# Patient Record
Sex: Male | Born: 1985 | Hispanic: Yes | Smoking: Current every day smoker
Health system: Southern US, Community
[De-identification: ages and names within clinical notes are randomized; demographics above are authoritative.]

---

## 2015-12-18 ENCOUNTER — Emergency Department: Payer: Self-pay

## 2015-12-18 ENCOUNTER — Encounter: Payer: Self-pay | Admitting: Emergency Medicine

## 2015-12-18 DIAGNOSIS — W01198A Fall on same level from slipping, tripping and stumbling with subsequent striking against other object, initial encounter: Secondary | ICD-10-CM | POA: Insufficient documentation

## 2015-12-18 DIAGNOSIS — F17210 Nicotine dependence, cigarettes, uncomplicated: Secondary | ICD-10-CM | POA: Insufficient documentation

## 2015-12-18 DIAGNOSIS — Y9301 Activity, walking, marching and hiking: Secondary | ICD-10-CM | POA: Insufficient documentation

## 2015-12-18 DIAGNOSIS — Y929 Unspecified place or not applicable: Secondary | ICD-10-CM | POA: Insufficient documentation

## 2015-12-18 DIAGNOSIS — Y990 Civilian activity done for income or pay: Secondary | ICD-10-CM | POA: Insufficient documentation

## 2015-12-18 DIAGNOSIS — M25462 Effusion, left knee: Secondary | ICD-10-CM | POA: Insufficient documentation

## 2015-12-18 NOTE — ED Notes (Signed)
Pt presents ED to be evaluated for left knee injury. Pt states he fell on it while walking and carrying a heavy object around 4pm today. No deformity noted. Pulse and sensation present at distal extremity.

## 2015-12-19 ENCOUNTER — Emergency Department
Admission: EM | Admit: 2015-12-19 | Discharge: 2015-12-19 | Disposition: A | Discharge status: Home / Self Care | Payer: Self-pay | Attending: Emergency Medicine | Admitting: Emergency Medicine

## 2015-12-19 DIAGNOSIS — M25562 Pain in left knee: Secondary | ICD-10-CM

## 2015-12-19 DIAGNOSIS — M25462 Effusion, left knee: Secondary | ICD-10-CM

## 2015-12-19 MED ORDER — HYDROCODONE-ACETAMINOPHEN 5-325 MG PO TABS
1.0000 | ORAL_TABLET | Freq: Once | ORAL | Status: AC
  Administered 2015-12-19: 1 via ORAL
  Filled 2015-12-19: qty 1

## 2015-12-19 MED ORDER — HYDROCODONE-ACETAMINOPHEN 5-325 MG PO TABS: 1.0000 | ORAL_TABLET | ORAL | Status: AC | PRN

## 2015-12-19 NOTE — ED Notes (Signed)
Pt reports he was at work when he slipped and hit his knee, feels like something is out of place. States he is able to bear weight but it hurts and feels swollen.

## 2015-12-19 NOTE — Discharge Instructions (Signed)
Please call the number for orthopedics if your knee pain and swelling does not improve over the next 1-2 weeks. Please use your crutches as needed for discomfort. Take your pain medication as needed, but only as prescribed. You may also take ibuprofen for discomfort.   Knee Effusion Knee effusion means that you have extra fluid in your knee. This can cause pain. Your knee may be more difficult to bend and move. HOME CARE  Use crutches as told by your doctor.  Wear a knee brace as told by your doctor.  Apply ice to the swollen area:  Put ice in a plastic bag.  Place a towel between your skin and the bag.  Leave the ice on for 20 minutes, 2-3 times per day.  Keep your knee raised (elevated) when you are sitting or lying down.  Take medicines only as told by your doctor.  Do any rehabilitation or strengthening exercises as told by your doctor.  Rest your knee as told by your doctor. You may start doing your normal activities again when your doctor says it is okay.  Keep all follow-up visits as told by your doctor. This is important. GET HELP IF:   You continue to have pain in your knee. GET HELP RIGHT AWAY IF:  You have increased swelling or redness of your knee.  You have severe pain in your knee.  You have a fever.   This information is not intended to replace advice given to you by your health care provider. Make sure you discuss any questions you have with your health care provider.   Document Released: 09/25/2010 Document Revised: 09/13/2014 Document Reviewed: 04/08/2014 Elsevier Interactive Patient Education [employer]2016 [employer]

## 2015-12-19 NOTE — ED Provider Notes (Signed)
[facility] Emergency Department Provider Note  Time seen: 1:25 AM  I have reviewed the triage vital signs and the nursing notes.   HISTORY  Chief Complaint Knee Injury Hospital interpreter used for this evaluation   HPI Richard Krause is a 30 y.o. male with no past medical history who presents to the emergency department left knee pain. According to the patient he slipped and fell hitting his left knee around 4 PM. States he's been able to walk but it is painful to walk on and it has swollen so he came to the emergency department for evaluation. Describes the pain as moderate, worse with ambulation. Denies any other injuries such as head injury or LOC.     History reviewed. No pertinent past medical history.  There are no active problems to display for this patient.   History reviewed. No pertinent past surgical history.  No current outpatient prescriptions on file.  Allergies Review of patient's allergies indicates no known allergies.  History reviewed. No pertinent family history.  Social History Social History  Substance Use Topics  . Smoking status: Current Every Day Smoker -- 0.40 packs/day    Types: Cigarettes  . Smokeless tobacco: Never Used  . Alcohol Use: Yes     Comment: 1 a week    Review of Systems Constitutional: Negative for fever. Cardiovascular: Negative for chest pain. Respiratory: Negative for shortness of breath. Gastrointestinal: Negative for abdominal pain Musculoskeletal: Moderate left knee pain 10-point ROS otherwise negative.  ____________________________________________   PHYSICAL EXAM:  VITAL SIGNS: ED Triage Vitals  Enc Vitals Group     BP 12/18/15 2252 118/74 mmHg     Pulse Rate 12/18/15 2252 99     Resp 12/18/15 2252 18     Temp 12/18/15 2252 98 F (36.7 C)     Temp Source 12/18/15 2252 Oral     SpO2 12/18/15 2252 96 %     Weight --      Height --      Head Cir --      Peak Flow --      Pain  Score 12/18/15 2246 4     Pain Loc --      Pain Edu? --      Excl. in GC? --     Constitutional: Alert and oriented. Well appearing and in no distress. Eyes: Normal exam ENT    Head: Normocephalic and atraumatic.    Mouth/Throat: Mucous membranes are moist. Cardiovascular: Normal rate, regular rhythm. No murmur Respiratory: Normal respiratory effort without tachypnea nor retractions. Breath sounds are clear Gastrointestinal: Soft and nontender. No distention.   Musculoskeletal: Moderate left knee joint effusion, moderate tenderness to palpation. Patella is midline, does not appear displaced. Neurovascularly intact distally with 2+ DP pulse, sensation is normal. Neurologic:  Normal speech and language. No gross focal neurologic deficits Skin:  Skin is warm, dry and intact.  Psychiatric: Mood and affect are normal. Speech and behavior are normal.  ____________________________________________   RADIOLOGY  X-ray shows joint effusion but negative for fracture.   INITIAL IMPRESSION / ASSESSMENT AND PLAN / ED COURSE  Pertinent labs & imaging results that were available during my care of the patient were reviewed by me and considered in my medical decision making (see chart for details).  Patient with left knee joint effusion, negative for fracture. Moderate tenderness with range of motion on exam. We will place the patient in an Ace bandage for compression, I discussed elevating and icing the knee.  We will discharge with crutches with weightbearing as tolerated and orthopedic follow-up in 1-2 weeks if not improved. Patient agreeable to plan.  ____________________________________________   FINAL CLINICAL IMPRESSION(S) / ED DIAGNOSES  Left knee pain Joint effusion   Minna AntisKevin Deniese Oberry, MD 12/19/15 [phone]

## 2017-01-07 IMAGING — CR DG KNEE 1-2V*L*
1 series · 2 of 2 positions shown · non-contrast
Comparison: None.

CLINICAL DATA: Pain following fall

EXAM:
LEFT KNEE - 1-2 VIEW

[Series 1: dg knee 1-2 views left · 0.14mm/px · 2 of 2 slices shown]
[im 1/2]
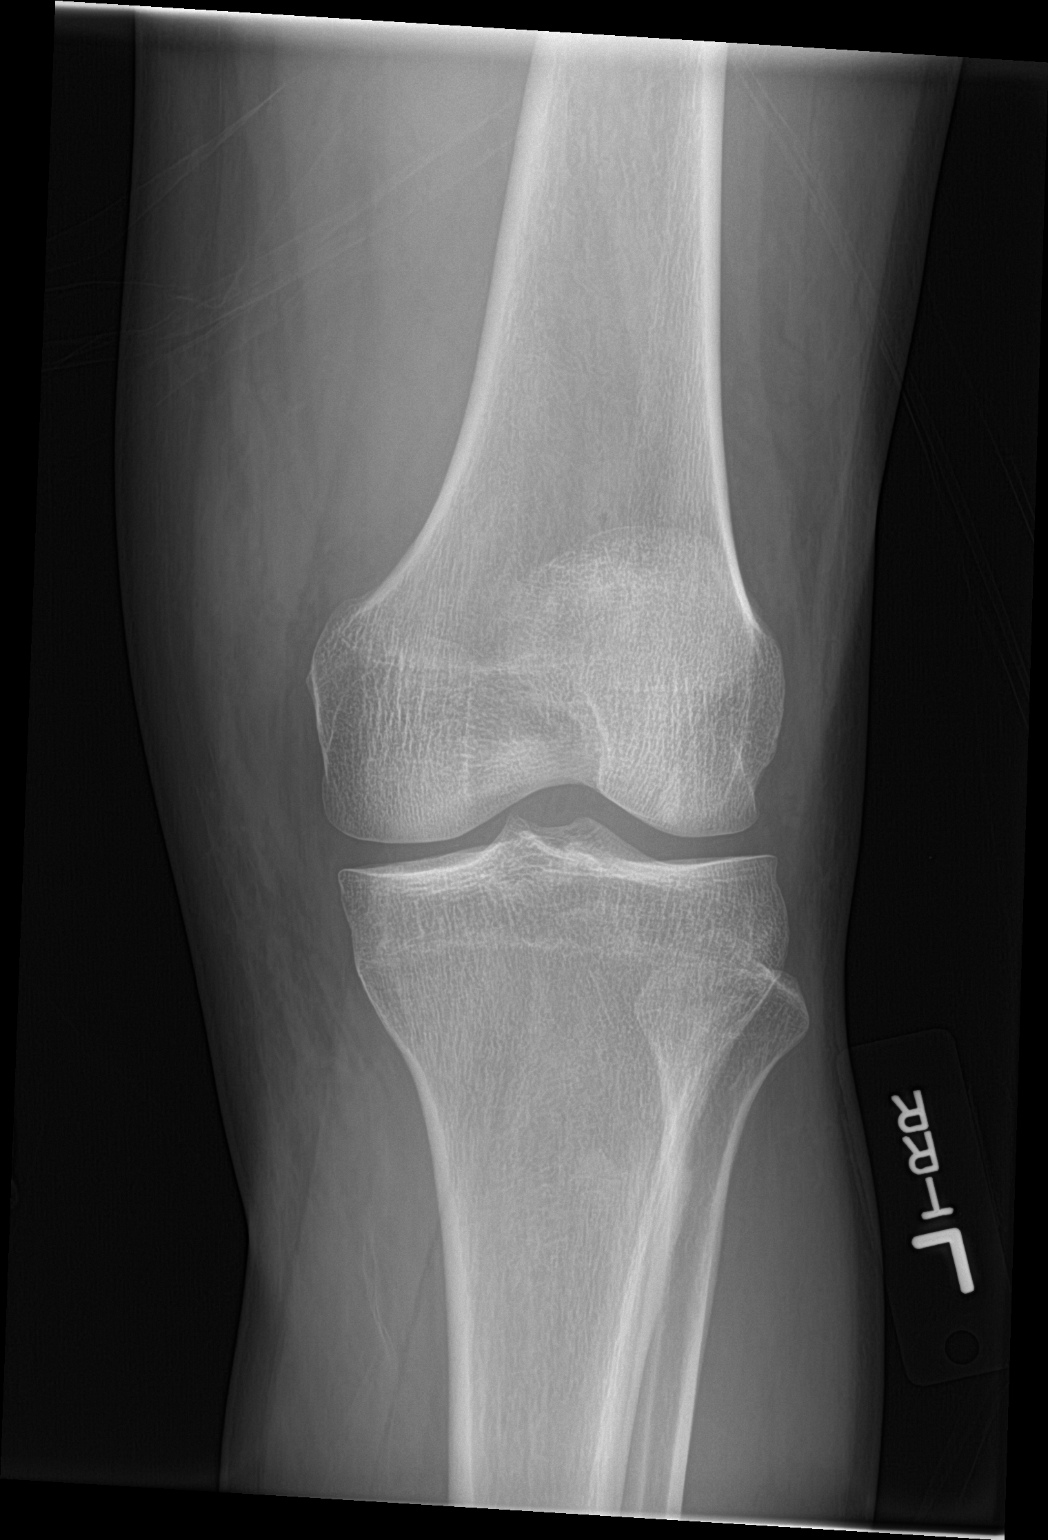
[im 2/2]
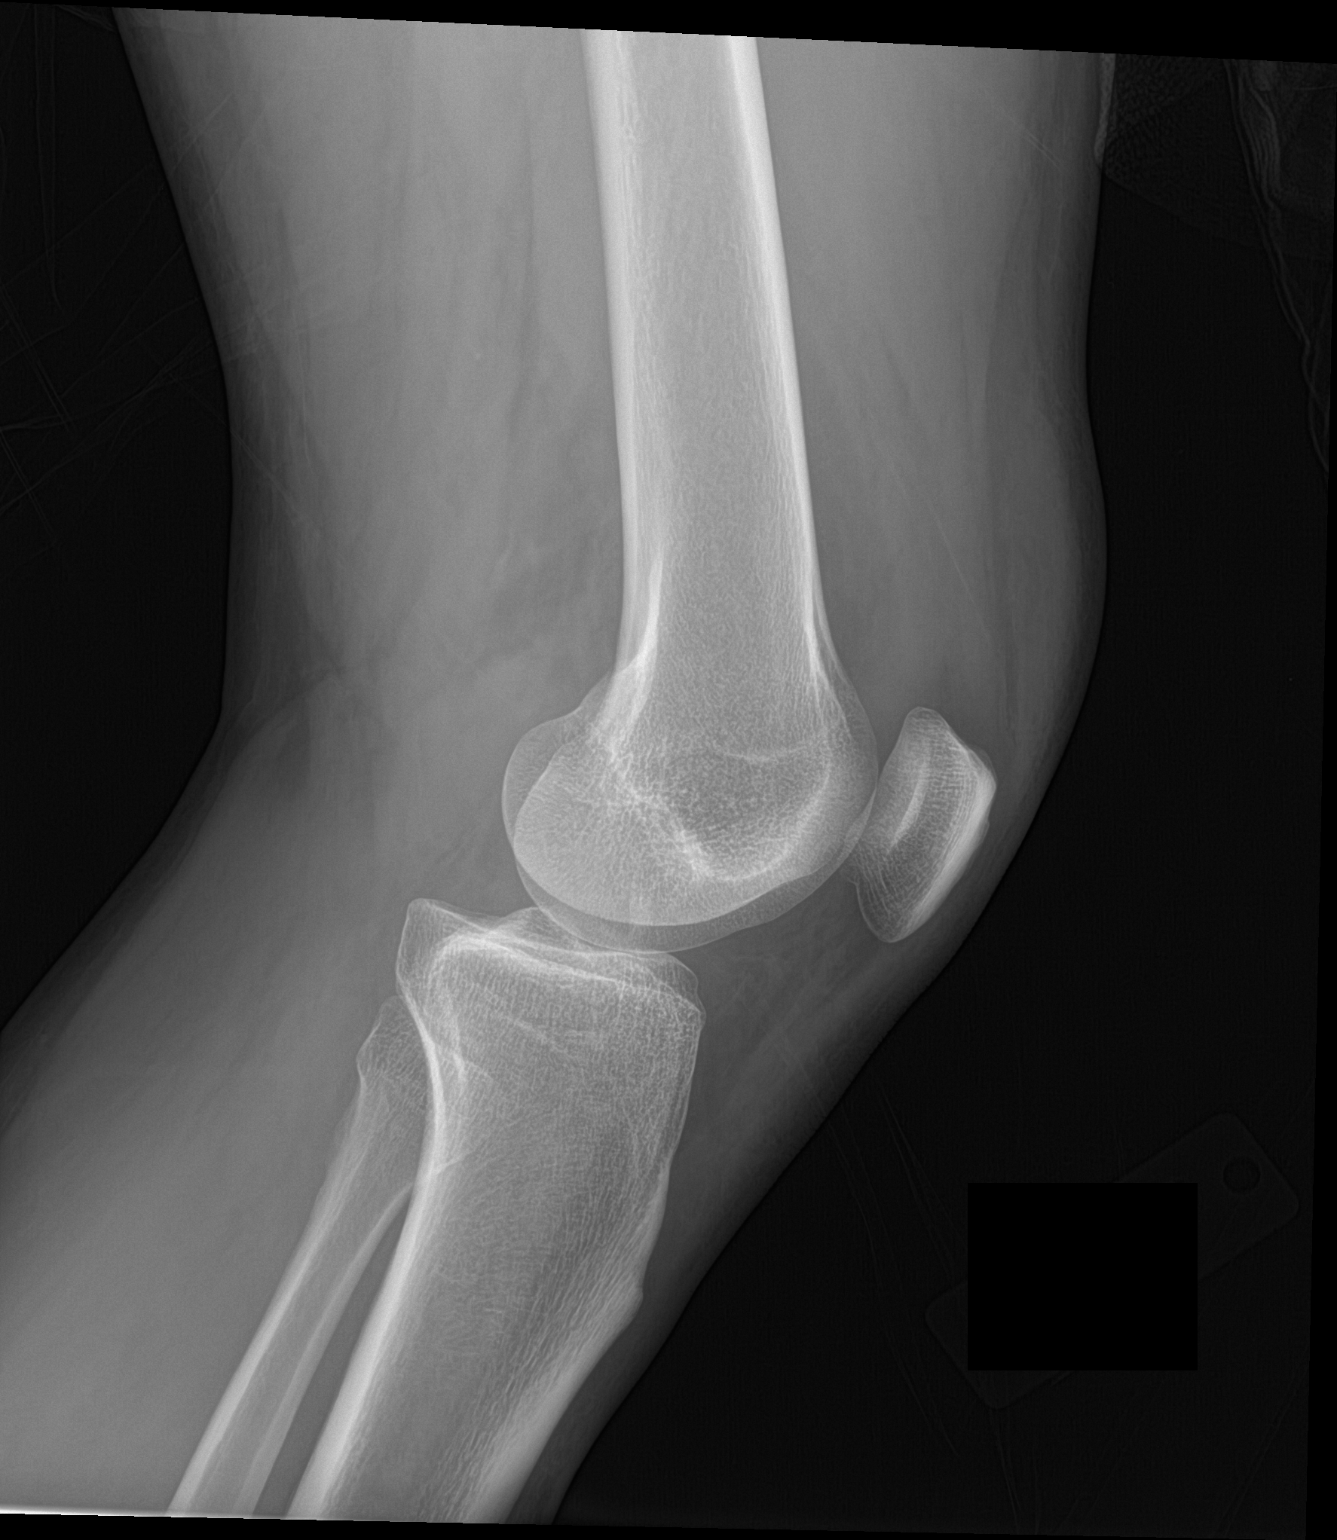

[2 of 2 positions shown; findings below may reference images not displayed]

FINDINGS: Frontal and lateral views were obtained. There is mild lateral
patellar subluxation. No fracture or dislocation. There is a small
joint effusion. There is no appreciable joint space narrowing. No
erosive change.
IMPRESSION: Mild lateral patellar subluxation. Small joint effusion. No fracture
or dislocation. No appreciable arthropathy.

## 2019-03-15 ENCOUNTER — Other Ambulatory Visit: Payer: Self-pay | Admitting: *Deleted
